# Patient Record
Sex: Female | Born: 1955 | Race: White | Hispanic: No | Marital: Married | Smoking: Never smoker
Health system: Southern US, Community
[De-identification: ages and names within clinical notes are randomized; demographics above are authoritative.]

## PROBLEM LIST (undated history)

## (undated) DIAGNOSIS — I10 Essential (primary) hypertension: Secondary | ICD-10-CM

## (undated) HISTORY — PX: BACK SURGERY: SHX140

## (undated) HISTORY — PX: ABDOMINAL HYSTERECTOMY: SHX81

---

## 1998-08-22 ENCOUNTER — Other Ambulatory Visit: Admission: RE | Admit: 1998-08-22 | Discharge: 1998-08-22 | Payer: Self-pay | Admitting: Obstetrics and Gynecology

## 1999-03-28 ENCOUNTER — Other Ambulatory Visit: Admission: RE | Admit: 1999-03-28 | Discharge: 1999-03-28 | Payer: Self-pay | Admitting: Obstetrics and Gynecology

## 1999-12-17 ENCOUNTER — Other Ambulatory Visit: Admission: RE | Admit: 1999-12-17 | Discharge: 1999-12-17 | Payer: Self-pay | Admitting: Obstetrics and Gynecology

## 2004-07-31 ENCOUNTER — Encounter: Admission: RE | Admit: 2004-07-31 | Discharge: 2004-07-31 | Payer: Self-pay | Admitting: Emergency Medicine

## 2004-08-16 ENCOUNTER — Ambulatory Visit (HOSPITAL_COMMUNITY): Admission: RE | Admit: 2004-08-16 | Discharge: 2004-08-16 | Payer: Self-pay | Admitting: Emergency Medicine

## 2005-11-18 ENCOUNTER — Encounter: Admission: RE | Admit: 2005-11-18 | Discharge: 2005-11-18 | Payer: Self-pay | Admitting: Emergency Medicine

## 2005-11-22 ENCOUNTER — Encounter: Admission: RE | Admit: 2005-11-22 | Discharge: 2005-11-22 | Payer: Self-pay | Admitting: Emergency Medicine

## 2006-12-08 ENCOUNTER — Encounter: Admission: RE | Admit: 2006-12-08 | Discharge: 2006-12-08 | Payer: Self-pay | Admitting: Emergency Medicine

## 2006-12-12 ENCOUNTER — Encounter: Admission: RE | Admit: 2006-12-12 | Discharge: 2006-12-12 | Payer: Self-pay | Admitting: Emergency Medicine

## 2006-12-17 ENCOUNTER — Encounter: Admission: RE | Admit: 2006-12-17 | Discharge: 2006-12-17 | Payer: Self-pay | Admitting: Emergency Medicine

## 2007-02-09 ENCOUNTER — Ambulatory Visit (HOSPITAL_COMMUNITY): Admission: RE | Admit: 2007-02-09 | Discharge: 2007-02-09 | Payer: Self-pay | Admitting: Obstetrics and Gynecology

## 2008-01-30 IMAGING — US US PELVIS COMPLETE
1 series · 14 of 25 positions shown · non-contrast
Comparison: None.

CLINICAL DATA: Menometrorrhagia.  
 TRANSABDOMINAL AND TRANSVAGINAL PELVIC ULTRASOUND:
TECHNIQUE: Both transabdominal and transvaginal ultrasound examinations of the pelvis were performed including evaluation of the uterus, ovaries, adnexal regions, and pelvic cul-de-sac.

[Series 1: unknown · 0.26mm/px · 14 of 69 slices shown]
[im 1/69]
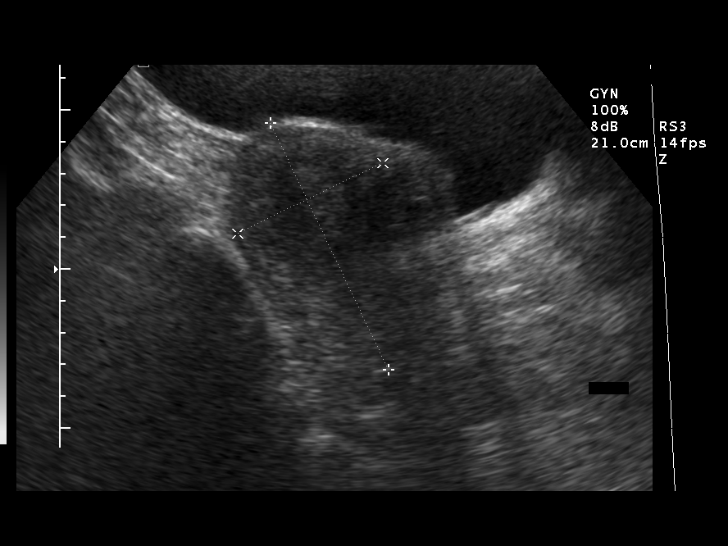
[im 6/69]
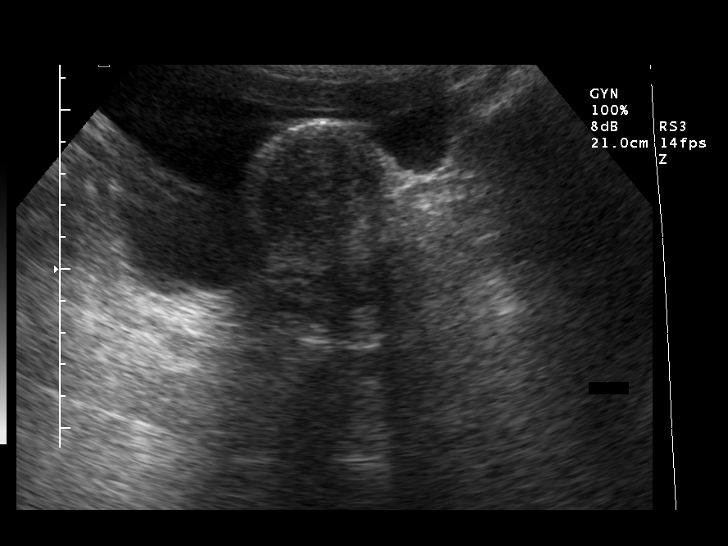
[im 12/69]
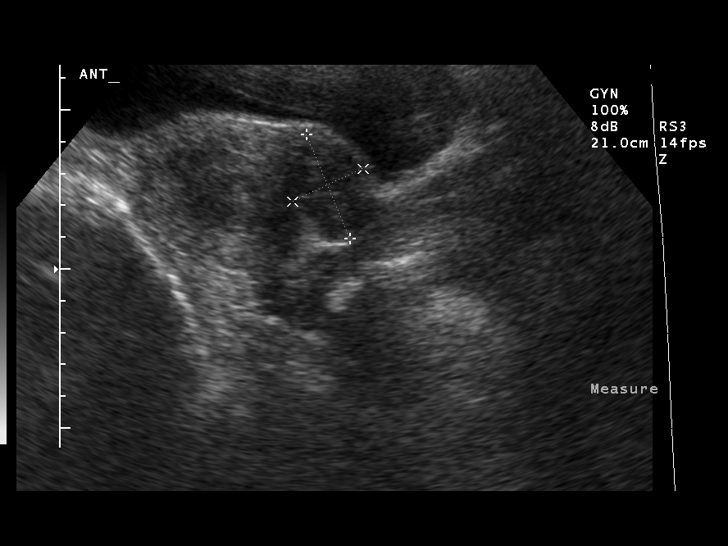
[im 18/69]
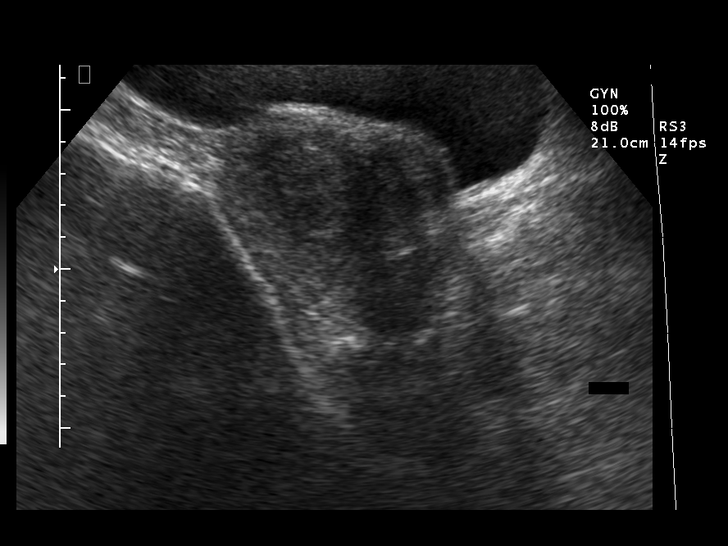
[im 23/69]
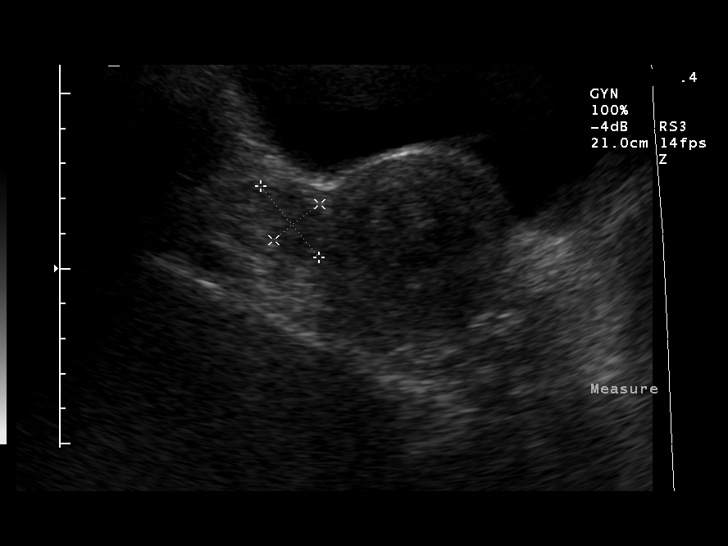
[im 26/69]
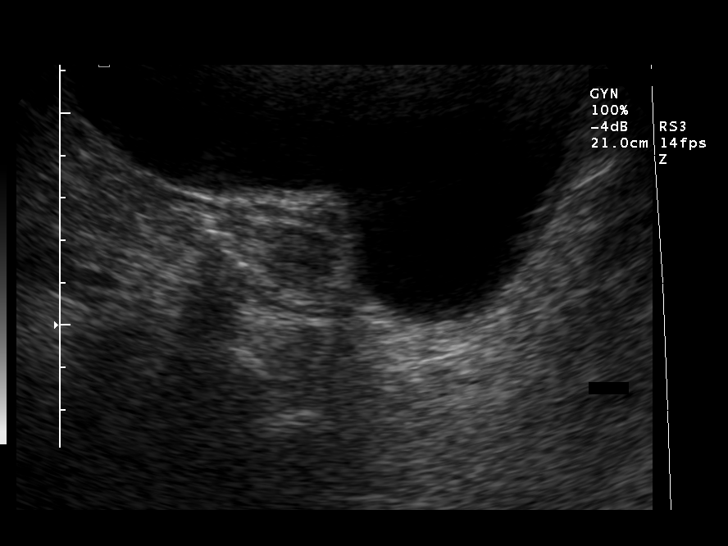
[im 32/69]
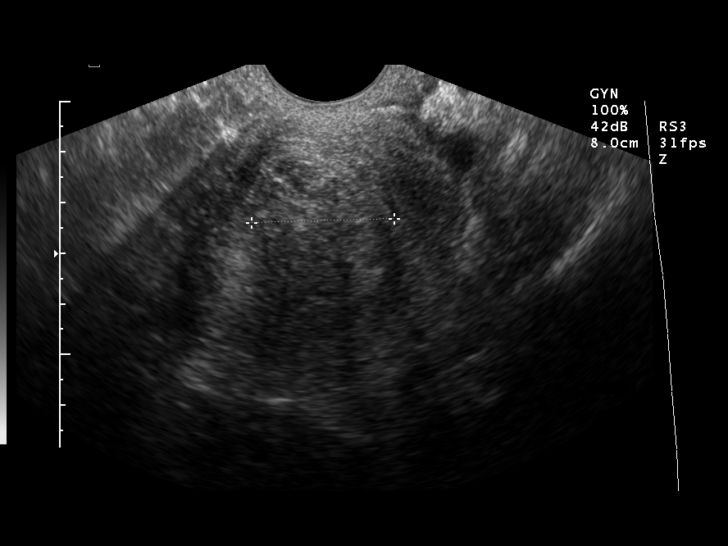
[im 37/69]
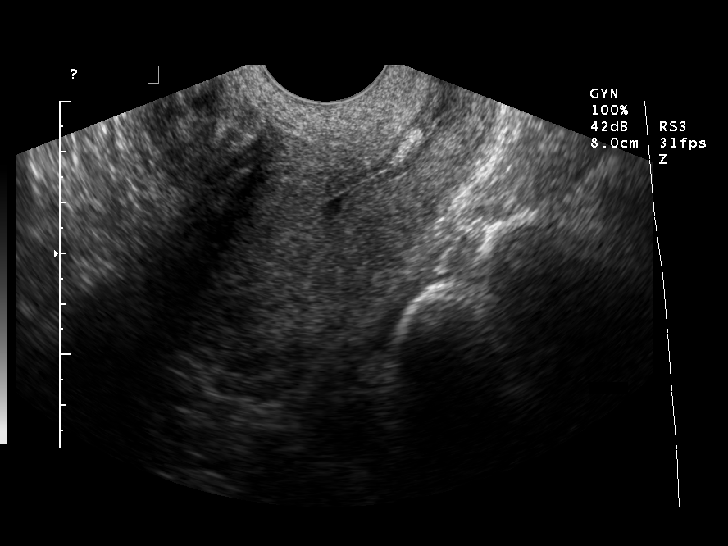
[im 43/69]
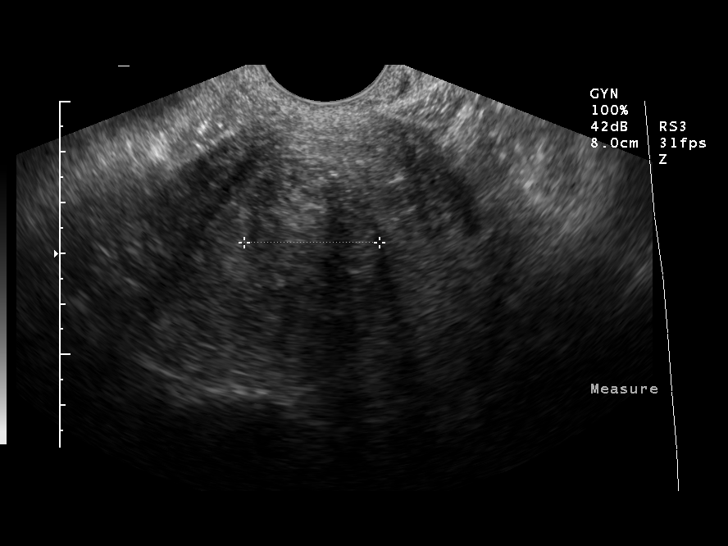
[im 46/69]
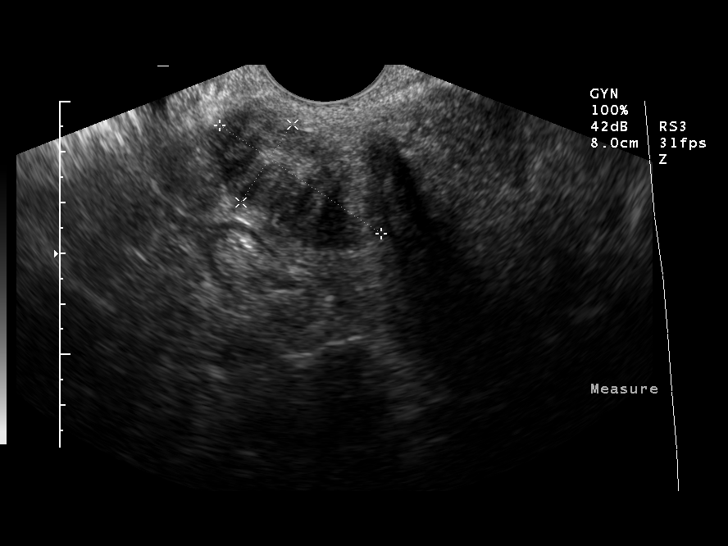
[im 52/69]
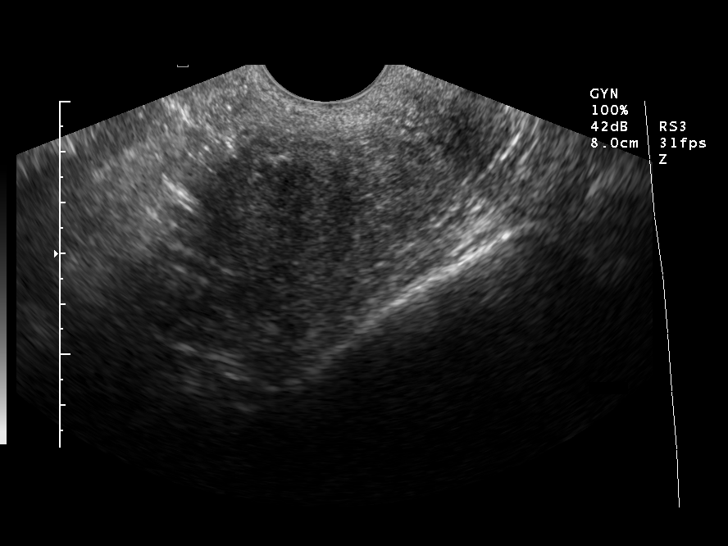
[im 57/69]
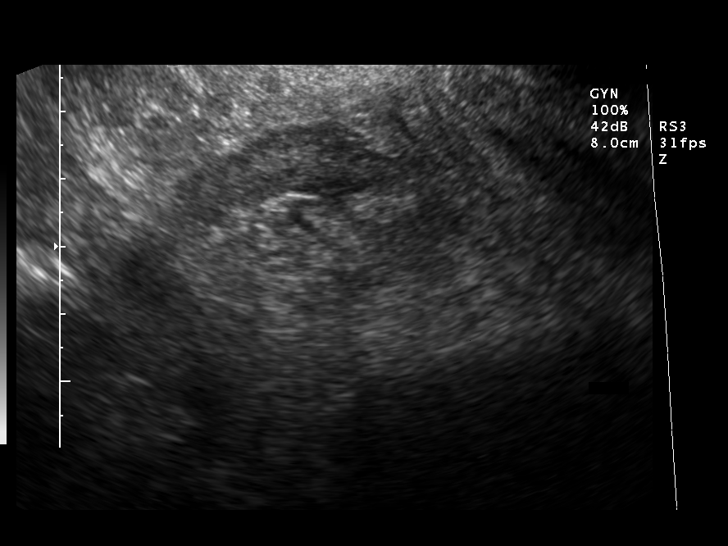
[im 63/69]
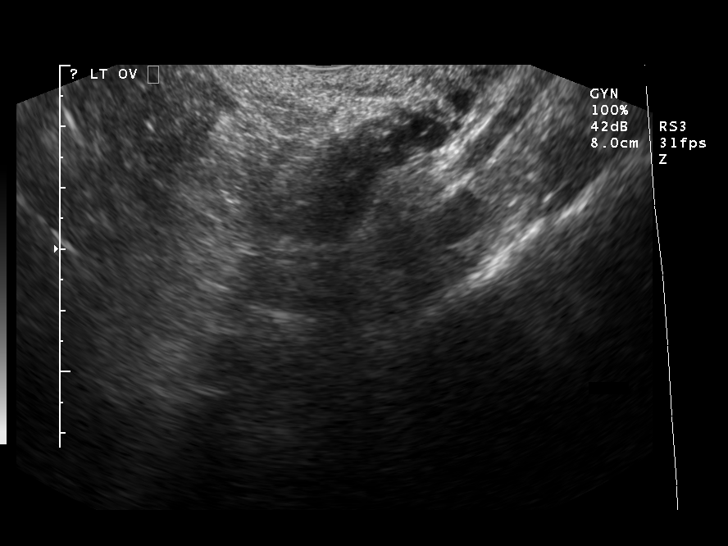
[im 69/69]
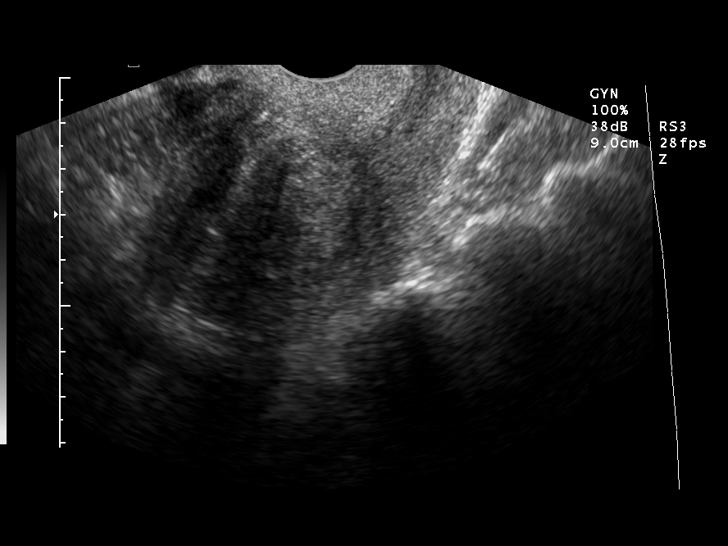

[14 of 25 positions shown; findings below may reference images not displayed]

FINDINGS: The uterus measures 8.6 x 5.6 x 5.1 cm.  There is a predominantly subserosal fibroid in the left uterine fundus anteriorly measuring 3.9 x 3.8 x 1.8 cm.  A shadowing mass is noted producing mass effect upon the anterior and endometrial stripe, measuring overall 3.2 x 3.1 x 2.8 cm.  This most likely represents a predominantly submucosal fibroid, although polyp is within the differential.  Endometrial stripe is not well evaluated due to the presence of the above-mentioned masses.  The ovaries are normal.  No free fluid.
IMPRESSION: 1. 3.9 cm anterior uterine fundal subserosal fibroid.
 2. Anterior uterine mass producing mass effect upon the endometrium, most likely predominantly subserosal fibroid, although a polyp is within the differential diagnosis.  This distinction could be made with sonohysterography, if clinically indicated.

## 2011-01-19 ENCOUNTER — Encounter: Payer: Self-pay | Admitting: Emergency Medicine

## 2011-01-20 ENCOUNTER — Encounter: Payer: Self-pay | Admitting: Emergency Medicine

## 2022-03-18 ENCOUNTER — Encounter (HOSPITAL_BASED_OUTPATIENT_CLINIC_OR_DEPARTMENT_OTHER): Payer: Self-pay | Admitting: *Deleted

## 2022-03-18 ENCOUNTER — Emergency Department (HOSPITAL_BASED_OUTPATIENT_CLINIC_OR_DEPARTMENT_OTHER)
Admission: EM | Admit: 2022-03-18 | Discharge: 2022-03-19 | Disposition: A | Discharge status: Home / Self Care | Payer: Medicare HMO | Attending: Emergency Medicine | Admitting: Emergency Medicine

## 2022-03-18 ENCOUNTER — Other Ambulatory Visit: Payer: Self-pay

## 2022-03-18 DIAGNOSIS — I491 Atrial premature depolarization: Secondary | ICD-10-CM | POA: Diagnosis not present

## 2022-03-18 DIAGNOSIS — R079 Chest pain, unspecified: Secondary | ICD-10-CM | POA: Diagnosis present

## 2022-03-18 DIAGNOSIS — R0789 Other chest pain: Secondary | ICD-10-CM

## 2022-03-18 DIAGNOSIS — Z79899 Other long term (current) drug therapy: Secondary | ICD-10-CM | POA: Insufficient documentation

## 2022-03-18 DIAGNOSIS — I10 Essential (primary) hypertension: Secondary | ICD-10-CM | POA: Diagnosis not present

## 2022-03-18 HISTORY — DX: Essential (primary) hypertension: I10

## 2022-03-18 LAB — CBC WITH DIFFERENTIAL/PLATELET
Abs Immature Granulocytes: 0.01 10*3/uL (ref 0.00–0.07)
Basophils Absolute: 0 10*3/uL (ref 0.0–0.1)
Basophils Relative: 1 %
Eosinophils Absolute: 0.3 10*3/uL (ref 0.0–0.5)
Eosinophils Relative: 6 %
HCT: 39.7 % (ref 36.0–46.0)
Hemoglobin: 13.3 g/dL (ref 12.0–15.0)
Immature Granulocytes: 0 %
Lymphocytes Relative: 27 %
Lymphs Abs: 1.4 10*3/uL (ref 0.7–4.0)
MCH: 31.6 pg (ref 26.0–34.0)
MCHC: 33.5 g/dL (ref 30.0–36.0)
MCV: 94.3 fL (ref 80.0–100.0)
Monocytes Absolute: 0.6 10*3/uL (ref 0.1–1.0)
Monocytes Relative: 12 %
Neutro Abs: 2.8 10*3/uL (ref 1.7–7.7)
Neutrophils Relative %: 54 %
Platelets: 286 10*3/uL (ref 150–400)
RBC: 4.21 MIL/uL (ref 3.87–5.11)
RDW: 13.3 % (ref 11.5–15.5)
WBC: 5.2 10*3/uL (ref 4.0–10.5)
nRBC: 0 % (ref 0.0–0.2)

## 2022-03-18 LAB — BASIC METABOLIC PANEL
Anion gap: 13 (ref 5–15)
BUN: 28 mg/dL — ABNORMAL HIGH (ref 8–23)
CO2: 22 mmol/L (ref 22–32)
Calcium: 9.5 mg/dL (ref 8.9–10.3)
Chloride: 105 mmol/L (ref 98–111)
Creatinine, Ser: 0.89 mg/dL (ref 0.44–1.00)
GFR, Estimated: >60 mL/min (ref >60)
Glucose, Bld: 98 mg/dL (ref 70–99)
Potassium: 3.6 mmol/L (ref 3.5–5.1)
Sodium: 140 mmol/L (ref 135–145)

## 2022-03-18 LAB — TROPONIN I (HIGH SENSITIVITY): Troponin I (High Sensitivity): 4 ng/L (ref <18)

## 2022-03-18 LAB — D-DIMER, QUANTITATIVE: D-Dimer, Quant: 0.59 ug/mL-FEU — ABNORMAL HIGH (ref 0.00–0.50)

## 2022-03-18 NOTE — ED Notes (Signed)
ED Provider at bedside. 

## 2022-03-18 NOTE — ED Triage Notes (Signed)
Palpitations. Pain in her chest x 3 days ago. She was seen at [facility] and told she pulled a muscle. Pain was relieved with Ibuprofen.  ?

## 2022-03-18 NOTE — ED Provider Notes (Signed)
? ?[facility] ?Provider Note: Georgena Spurling, MD, FACEP ? ?CSN: [id number] ?MRN: [id number] ?ARRIVAL: 03/18/22 at 2215 ?ROOM: [facility] ? ? ?CHIEF COMPLAINT  ?Chest Pain ? ? ?HISTORY OF PRESENT ILLNESS  ?03/18/22 10:47 PM ?Darlene Vargas is a 66 y.o. female who developed pain in the back of her neck 3 days ago.  The pain radiated down the left side of her neck and into her left upper chest.  The pain was sharp, moderate in severity, and worse with deep breathing, especially at the end of a deep breath.  She was seen at an urgent care and told she pulled a muscle.  She was treated with ibuprofen with relief.  The pain lasted 2 days but worsened the next day radiating up into her lower jaw.  Yesterday and today she has not had any chest pain but has had palpitations, a sense of racing heart (initial heart rate here was listed as 155 but was subsequently measured at 93).  She has also been short of breath the past 2 days.  She denies any recent travel.  She denies any current chest pain.  She denies any lower extremity pain or swelling. ? ? ?Past Medical History:  ?Diagnosis Date  ? Hypertension   ? ? ?Past Surgical History:  ?Procedure Laterality Date  ? ABDOMINAL HYSTERECTOMY    ? BACK SURGERY    ? ? ?No family history on file. ? ?Social History  ? ?Tobacco Use  ? Smoking status: Never  ? Smokeless tobacco: Never  ?Vaping Use  ? Vaping Use: Never used  ?Substance Use Topics  ? Alcohol use: Yes  ? Drug use: Never  ? ? ?Prior to Admission medications   ?Medication Sig Start Date End Date Taking? Authorizing Provider  ?hydrochlorothiazide (HYDRODIURIL) 12.5 MG tablet Take by mouth.   Yes [provider]  ?valsartan (DIOVAN) 160 MG tablet Take by mouth. 12/03/16  Yes [provider]  ? ? ?Allergies ?Lisinopril ? ? ?REVIEW OF SYSTEMS  ?Negative except as noted here or in the History of Present Illness. ? ? ?PHYSICAL EXAMINATION  ?Initial Vital Signs ?Blood pressure (!) 156/75, pulse 93,  temperature 97.9 ?F (36.6 ?C), temperature source Oral, resp. rate 19, height 5\' 5"  (1.651 m), weight 74.8 kg, SpO2 100 %. ? ?Examination ?General: Well-developed, well-nourished female in no acute distress; appearance consistent with age of record ?HENT: normocephalic; atraumatic ?Eyes: Normal appearance ?Neck: supple ?Heart: regular rate and rhythm; frequent PACs ?Lungs: clear to auscultation bilaterally ?Abdomen: soft; nondistended; nontender; bowel sounds present ?Extremities: No deformity; full range of motion; pulses normal; no edema ?Neurologic: Awake, alert and oriented; motor function intact in all extremities and symmetric; no facial droop ?Skin: Warm and dry ?Psychiatric: Normal mood and affect ? ? ?RESULTS  ?Summary of this visit's results, reviewed and interpreted by myself: ? ? EKG Interpretation ? ?Date/Time:  Monday March 18 2022 22:30:24 EDT ?Ventricular Rate:  106 ?PR Interval:  204 ?QRS Duration: 78 ?QT Interval:  346 ?QTC Calculation: 459 ?R Axis:   60 ?Text Interpretation: Sinus tachycardia T wave abnormality, consider lateral ischemia Abnormal ECG No previous ECGs available Confirmed by Monalisa Bayless [id number]) on 03/18/2022 10:36:17 PM ?  ? ?  ? ?Laboratory Studies: ?Results for orders placed or performed during the hospital encounter of 03/18/22 (from the past 24 hour(s))  ?Troponin I (High Sensitivity)     Status: None  ? Collection Time: 03/18/22 10:48 PM  ?Result Value Ref Range  ? Troponin I (  High Sensitivity) 4 <18 ng/L  ?CBC with Differential     Status: None  ? Collection Time: 03/18/22 10:48 PM  ?Result Value Ref Range  ? WBC 5.2 4.0 - 10.5 K/uL  ? RBC 4.21 3.87 - 5.11 MIL/uL  ? Hemoglobin 13.3 12.0 - 15.0 g/dL  ? HCT 39.7 36.0 - 46.0 %  ? MCV 94.3 80.0 - 100.0 fL  ? MCH 31.6 26.0 - 34.0 pg  ? MCHC 33.5 30.0 - 36.0 g/dL  ? RDW 13.3 11.5 - 15.5 %  ? Platelets 286 150 - 400 K/uL  ? nRBC 0.0 0.0 - 0.2 %  ? Neutrophils Relative % 54 %  ? Neutro Abs 2.8 1.7 - 7.7 K/uL  ? Lymphocytes Relative 27  %  ? Lymphs Abs 1.4 0.7 - 4.0 K/uL  ? Monocytes Relative 12 %  ? Monocytes Absolute 0.6 0.1 - 1.0 K/uL  ? Eosinophils Relative 6 %  ? Eosinophils Absolute 0.3 0.0 - 0.5 K/uL  ? Basophils Relative 1 %  ? Basophils Absolute 0.0 0.0 - 0.1 K/uL  ? Immature Granulocytes 0 %  ? Abs Immature Granulocytes 0.01 0.00 - 0.07 K/uL  ?Basic metabolic panel     Status: Abnormal  ? Collection Time: 03/18/22 10:48 PM  ?Result Value Ref Range  ? Sodium 140 135 - 145 mmol/L  ? Potassium 3.6 3.5 - 5.1 mmol/L  ? Chloride 105 98 - 111 mmol/L  ? CO2 22 22 - 32 mmol/L  ? Glucose, Bld 98 70 - 99 mg/dL  ? BUN 28 (H) 8 - 23 mg/dL  ? Creatinine, Ser 0.89 0.44 - 1.00 mg/dL  ? Calcium 9.5 8.9 - 10.3 mg/dL  ? GFR, Estimated >60 >60 mL/min  ? Anion gap 13 5 - 15  ?D-dimer, quantitative     Status: Abnormal  ? Collection Time: 03/18/22 10:48 PM  ?Result Value Ref Range  ? D-Dimer, Quant 0.59 (H) 0.00 - 0.50 ug/mL-FEU  ?Troponin I (High Sensitivity)     Status: None  ? Collection Time: 03/19/22 12:51 AM  ?Result Value Ref Range  ? Troponin I (High Sensitivity) 4 <18 ng/L  ?Urinalysis, Routine w reflex microscopic Urine, Clean Catch     Status: Abnormal  ? Collection Time: 03/19/22 12:54 AM  ?Result Value Ref Range  ? Color, Urine YELLOW YELLOW  ? APPearance CLEAR CLEAR  ? Specific Gravity, Urine 1.020 1.005 - 1.030  ? pH 5.5 5.0 - 8.0  ? Glucose, UA NEGATIVE NEGATIVE mg/dL  ? Hgb urine dipstick NEGATIVE NEGATIVE  ? Bilirubin Urine NEGATIVE NEGATIVE  ? Ketones, ur 15 (A) NEGATIVE mg/dL  ? Protein, ur NEGATIVE NEGATIVE mg/dL  ? Nitrite NEGATIVE NEGATIVE  ? Leukocytes,Ua TRACE (A) NEGATIVE  ?Urinalysis, Microscopic (reflex)     Status: Abnormal  ? Collection Time: 03/19/22 12:54 AM  ?Result Value Ref Range  ? RBC / HPF 0-5 0 - 5 RBC/hpf  ? WBC, UA 0-5 0 - 5 WBC/hpf  ? Bacteria, UA RARE (A) NONE SEEN  ? Squamous Epithelial / LPF 0-5 0 - 5  ? ?Imaging Studies: ?DG Chest 2 View ? ?Result Date: 03/19/2022 ?CLINICAL DATA:  Shortness of breath, palpitations  EXAM: CHEST - 2 VIEW COMPARISON:  11/18/2005 FINDINGS: Lungs are clear.  No pleural effusion or pneumothorax. The heart is normal in size. Mild degenerative changes of the visualized thoracolumbar spine. IMPRESSION: Normal chest radiographs. Electronically Signed   By: Julian Hy M.D.   On: 03/19/2022 02:02   ? ?ED  COURSE and MDM  ?Nursing notes, initial and subsequent vitals signs, including pulse oximetry, reviewed and interpreted by myself. ? ?Vitals:  ? 03/19/22 0015 03/19/22 0055 03/19/22 0056 03/19/22 0100  ?BP: 118/70 123/69  111/66  ?Pulse: 75 84 84 76  ?Resp: 12 (!) 22 (!) 22 14  ?Temp:      ?TempSrc:      ?SpO2: 96% 98% 98% 97%  ?Weight:      ?Height:      ? ?Medications - No data to display ? ?1:35 AM ?Age-adjusted D-dimer ceiling is 0.65, greater than the patient's measured 0.59.  Wells score is 1.5 (PE unlikely). ? ?2:15 AM ?Patient's troponins are 4 and she has had no chest pain since 3 days ago (Saturday).  Although she has sinus tachycardia on arrival I did not personally witness a heart rate of 155.  Her heart rate has been below 100 for most of the time since her arrival.  She does continue to have frequent PACs which are mildly symptomatic.  We will check a TSH and magnesium.  The patient was advised we will not get those results during this visit but her PCP will see them on Monday (6 days from today) at the appointment she already has scheduled. ? ?PROCEDURES  ?Procedures ? ? ?ED DIAGNOSES  ? ?  ICD-10-CM   ?1. Atypical chest pain  R07.89   ?  ?2. Premature atrial contractions  I49.1   ?  ? ? ? ?  ?Shanon Rosser, MD ?03/19/22 0216 ? ?

## 2022-03-19 ENCOUNTER — Emergency Department (HOSPITAL_BASED_OUTPATIENT_CLINIC_OR_DEPARTMENT_OTHER): Payer: Medicare HMO

## 2022-03-19 LAB — URINALYSIS, MICROSCOPIC (REFLEX)

## 2022-03-19 LAB — URINALYSIS, ROUTINE W REFLEX MICROSCOPIC
Bilirubin Urine: NEGATIVE
Glucose, UA: NEGATIVE mg/dL
Hgb urine dipstick: NEGATIVE
Ketones, ur: 15 mg/dL — AB
Nitrite: NEGATIVE
Protein, ur: NEGATIVE mg/dL
Specific Gravity, Urine: 1.02 (ref 1.005–1.030)
pH: 5.5 (ref 5.0–8.0)

## 2022-03-19 LAB — TROPONIN I (HIGH SENSITIVITY): Troponin I (High Sensitivity): 4 ng/L

## 2022-03-19 LAB — MAGNESIUM: Magnesium: 1.9 mg/dL (ref 1.7–2.4)

## 2022-03-19 LAB — TSH: TSH: 19.609 u[IU]/mL — ABNORMAL HIGH (ref 0.350–4.500)

## 2022-03-19 NOTE — ED Notes (Signed)
Patient discharged to home.  All discharge instructions reviewed.  Patient verbalized understanding via teachback method.  VS WDL.  Respirations even and unlabored.  Ambulatory out of ED.   °
